# Patient Record
Sex: Male | Born: 1990 | Race: White | Hispanic: No | Marital: Single | Smoking: Current every day smoker
Health system: Southern US, Community
[De-identification: ages and names within clinical notes are randomized; demographics above are authoritative.]

## PROBLEM LIST (undated history)

## (undated) HISTORY — PX: FINGER SURGERY: SHX640

---

## 2018-01-22 ENCOUNTER — Other Ambulatory Visit: Payer: Self-pay

## 2018-01-22 ENCOUNTER — Encounter (HOSPITAL_BASED_OUTPATIENT_CLINIC_OR_DEPARTMENT_OTHER): Payer: Self-pay | Admitting: *Deleted

## 2018-01-22 ENCOUNTER — Emergency Department (HOSPITAL_BASED_OUTPATIENT_CLINIC_OR_DEPARTMENT_OTHER): Payer: Self-pay

## 2018-01-22 ENCOUNTER — Emergency Department (HOSPITAL_BASED_OUTPATIENT_CLINIC_OR_DEPARTMENT_OTHER)
Admission: EM | Admit: 2018-01-22 | Discharge: 2018-01-22 | Disposition: A | Discharge status: Home / Self Care | Payer: Self-pay | Attending: Emergency Medicine | Admitting: Emergency Medicine

## 2018-01-22 DIAGNOSIS — F17210 Nicotine dependence, cigarettes, uncomplicated: Secondary | ICD-10-CM | POA: Insufficient documentation

## 2018-01-22 DIAGNOSIS — Y939 Activity, unspecified: Secondary | ICD-10-CM | POA: Insufficient documentation

## 2018-01-22 DIAGNOSIS — Y999 Unspecified external cause status: Secondary | ICD-10-CM | POA: Insufficient documentation

## 2018-01-22 DIAGNOSIS — S0083XA Contusion of other part of head, initial encounter: Secondary | ICD-10-CM | POA: Insufficient documentation

## 2018-01-22 DIAGNOSIS — S0240FA Zygomatic fracture, left side, initial encounter for closed fracture: Secondary | ICD-10-CM | POA: Insufficient documentation

## 2018-01-22 DIAGNOSIS — Z23 Encounter for immunization: Secondary | ICD-10-CM | POA: Insufficient documentation

## 2018-01-22 DIAGNOSIS — Y929 Unspecified place or not applicable: Secondary | ICD-10-CM | POA: Insufficient documentation

## 2018-01-22 MED ORDER — HYDROCODONE-ACETAMINOPHEN 5-325 MG PO TABS
1.0000 | ORAL_TABLET | Freq: Once | ORAL | Status: AC
  Administered 2018-01-22: 1 via ORAL
  Filled 2018-01-22: qty 1

## 2018-01-22 MED ORDER — TETANUS-DIPHTH-ACELL PERTUSSIS 5-2.5-18.5 LF-MCG/0.5 IM SUSP
0.5000 mL | Freq: Once | INTRAMUSCULAR | Status: AC
  Administered 2018-01-22: 0.5 mL via INTRAMUSCULAR

## 2018-01-22 MED ORDER — TRAMADOL HCL 50 MG PO TABS: 50.0000 mg | ORAL_TABLET | Freq: Four times a day (QID) | ORAL | 0 refills | Status: AC | PRN

## 2018-01-22 MED ORDER — TETANUS-DIPHTH-ACELL PERTUSSIS 5-2.5-18.5 LF-MCG/0.5 IM SUSP
INTRAMUSCULAR | Status: AC
  Filled 2018-01-22: qty 0.5

## 2018-01-22 MED ORDER — IBUPROFEN 800 MG PO TABS: 800.0000 mg | ORAL_TABLET | Freq: Three times a day (TID) | ORAL | 0 refills | Status: AC | PRN

## 2018-01-22 NOTE — ED Notes (Signed)
Patient transported to CT 

## 2018-01-22 NOTE — Discharge Instructions (Signed)
You will need to follow-up with the specialist provided.  You have a fracture of a bone in the left side of your face.
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
